# Patient Record
Sex: Male | Born: 1960 | Race: White | Hispanic: No | Marital: Married | Smoking: Never smoker
Health system: Southern US, Community
[De-identification: ages and names within clinical notes are randomized; demographics above are authoritative.]

## PROBLEM LIST (undated history)

## (undated) DIAGNOSIS — I10 Essential (primary) hypertension: Secondary | ICD-10-CM

## (undated) DIAGNOSIS — N200 Calculus of kidney: Secondary | ICD-10-CM

## (undated) DIAGNOSIS — E7800 Pure hypercholesterolemia, unspecified: Secondary | ICD-10-CM

---

## 2020-03-14 ENCOUNTER — Encounter: Payer: Self-pay | Admitting: Emergency Medicine

## 2020-03-14 ENCOUNTER — Observation Stay: Payer: BLUE CROSS/BLUE SHIELD

## 2020-03-14 ENCOUNTER — Other Ambulatory Visit: Payer: Self-pay

## 2020-03-14 ENCOUNTER — Observation Stay
Admission: EM | Admit: 2020-03-14 | Discharge: 2020-03-15 | Disposition: A | Discharge status: Home / Self Care | Payer: BLUE CROSS/BLUE SHIELD | Attending: Internal Medicine | Admitting: Internal Medicine

## 2020-03-14 ENCOUNTER — Emergency Department: Payer: BLUE CROSS/BLUE SHIELD

## 2020-03-14 DIAGNOSIS — R4701 Aphasia: Secondary | ICD-10-CM | POA: Diagnosis not present

## 2020-03-14 DIAGNOSIS — R410 Disorientation, unspecified: Secondary | ICD-10-CM | POA: Diagnosis present

## 2020-03-14 DIAGNOSIS — I10 Essential (primary) hypertension: Secondary | ICD-10-CM | POA: Insufficient documentation

## 2020-03-14 DIAGNOSIS — E785 Hyperlipidemia, unspecified: Secondary | ICD-10-CM | POA: Diagnosis not present

## 2020-03-14 DIAGNOSIS — R130 Aphagia: Secondary | ICD-10-CM

## 2020-03-14 DIAGNOSIS — Z20822 Contact with and (suspected) exposure to covid-19: Secondary | ICD-10-CM | POA: Diagnosis not present

## 2020-03-14 DIAGNOSIS — G459 Transient cerebral ischemic attack, unspecified: Secondary | ICD-10-CM | POA: Diagnosis present

## 2020-03-14 DIAGNOSIS — R519 Headache, unspecified: Secondary | ICD-10-CM | POA: Diagnosis not present

## 2020-03-14 DIAGNOSIS — Z7982 Long term (current) use of aspirin: Secondary | ICD-10-CM | POA: Insufficient documentation

## 2020-03-14 DIAGNOSIS — R531 Weakness: Secondary | ICD-10-CM | POA: Insufficient documentation

## 2020-03-14 DIAGNOSIS — Z79899 Other long term (current) drug therapy: Secondary | ICD-10-CM | POA: Diagnosis not present

## 2020-03-14 DIAGNOSIS — Z87442 Personal history of urinary calculi: Secondary | ICD-10-CM | POA: Diagnosis not present

## 2020-03-14 DIAGNOSIS — E7800 Pure hypercholesterolemia, unspecified: Secondary | ICD-10-CM | POA: Insufficient documentation

## 2020-03-14 HISTORY — DX: Pure hypercholesterolemia, unspecified: E78.00

## 2020-03-14 HISTORY — DX: Calculus of kidney: N20.0

## 2020-03-14 HISTORY — DX: Essential (primary) hypertension: I10

## 2020-03-14 LAB — COMPREHENSIVE METABOLIC PANEL
ALT: 38 U/L (ref 0–44)
AST: 21 U/L (ref 15–41)
Albumin: 4.2 g/dL (ref 3.5–5.0)
Alkaline Phosphatase: 63 U/L (ref 38–126)
Anion gap: 9 (ref 5–15)
BUN: 27 mg/dL — ABNORMAL HIGH (ref 6–20)
CO2: 25 mmol/L (ref 22–32)
Calcium: 9.5 mg/dL (ref 8.9–10.3)
Chloride: 105 mmol/L (ref 98–111)
Creatinine, Ser: 1.14 mg/dL (ref 0.61–1.24)
GFR calc Af Amer: >60 mL/min (ref >60)
GFR calc non Af Amer: >60 mL/min (ref >60)
Glucose, Bld: 89 mg/dL (ref 70–99)
Potassium: 3.6 mmol/L (ref 3.5–5.1)
Sodium: 139 mmol/L (ref 135–145)
Total Bilirubin: 0.9 mg/dL (ref 0.3–1.2)
Total Protein: 7.3 g/dL (ref 6.5–8.1)

## 2020-03-14 LAB — DIFFERENTIAL
Abs Immature Granulocytes: 0.02 10*3/uL (ref 0.00–0.07)
Basophils Absolute: 0.1 10*3/uL (ref 0.0–0.1)
Basophils Relative: 1 %
Eosinophils Absolute: 0.2 10*3/uL (ref 0.0–0.5)
Eosinophils Relative: 2 %
Immature Granulocytes: 0 %
Lymphocytes Relative: 27 %
Lymphs Abs: 2 10*3/uL (ref 0.7–4.0)
Monocytes Absolute: 0.8 10*3/uL (ref 0.1–1.0)
Monocytes Relative: 10 %
Neutro Abs: 4.5 10*3/uL (ref 1.7–7.7)
Neutrophils Relative %: 60 %

## 2020-03-14 LAB — CBC
HCT: 44.7 % (ref 39.0–52.0)
Hemoglobin: 15.5 g/dL (ref 13.0–17.0)
MCH: 30.9 pg (ref 26.0–34.0)
MCHC: 34.7 g/dL (ref 30.0–36.0)
MCV: 89.2 fL (ref 80.0–100.0)
Platelets: 292 10*3/uL (ref 150–400)
RBC: 5.01 MIL/uL (ref 4.22–5.81)
RDW: 12.4 % (ref 11.5–15.5)
WBC: 7.4 10*3/uL (ref 4.0–10.5)
nRBC: 0 % (ref 0.0–0.2)

## 2020-03-14 LAB — PROTIME-INR
INR: 1 (ref 0.8–1.2)
Prothrombin Time: 12.7 seconds (ref 11.4–15.2)

## 2020-03-14 LAB — GLUCOSE, CAPILLARY: Glucose-Capillary: 95 mg/dL (ref 70–99)

## 2020-03-14 LAB — APTT: aPTT: 35 seconds (ref 24–36)

## 2020-03-14 MED ORDER — ATORVASTATIN CALCIUM 20 MG PO TABS: 20.0000 mg | ORAL_TABLET | Freq: Every day | ORAL | Status: DC

## 2020-03-14 MED ORDER — IRBESARTAN 150 MG PO TABS
150.0000 mg | ORAL_TABLET | Freq: Every day | ORAL | Status: DC
  Administered 2020-03-15: 150 mg via ORAL
  Filled 2020-03-14: qty 1

## 2020-03-14 MED ORDER — STROKE: EARLY STAGES OF RECOVERY BOOK: Freq: Once | Status: AC

## 2020-03-14 MED ORDER — ATORVASTATIN CALCIUM 20 MG PO TABS
80.0000 mg | ORAL_TABLET | Freq: Every day | ORAL | Status: DC
  Administered 2020-03-15: 80 mg via ORAL
  Filled 2020-03-14 (×2): qty 4

## 2020-03-14 MED ORDER — SODIUM CHLORIDE 0.9% FLUSH: 3.0000 mL | Freq: Once | INTRAVENOUS | Status: DC

## 2020-03-14 MED ORDER — HYDROCHLOROTHIAZIDE 12.5 MG PO CAPS
12.5000 mg | ORAL_CAPSULE | Freq: Every day | ORAL | Status: DC
  Administered 2020-03-15: 12.5 mg via ORAL
  Filled 2020-03-14: qty 1

## 2020-03-14 MED ORDER — SODIUM CHLORIDE 0.9 % IV SOLN: INTRAVENOUS | Status: DC

## 2020-03-14 MED ORDER — VALSARTAN-HYDROCHLOROTHIAZIDE 320-25 MG PO TABS: 0.5000 | ORAL_TABLET | Freq: Every day | ORAL | Status: DC

## 2020-03-14 MED ORDER — COENZYME Q10 30 MG PO CAPS: 30.0000 mg | ORAL_CAPSULE | Freq: Every day | ORAL | Status: DC

## 2020-03-14 MED ORDER — ADULT MULTIVITAMIN W/MINERALS CH
1.0000 | ORAL_TABLET | Freq: Every day | ORAL | Status: DC
  Administered 2020-03-15: 01:00:00 1 via ORAL
  Filled 2020-03-14 (×2): qty 1

## 2020-03-14 MED ORDER — ACETAMINOPHEN 325 MG PO TABS: 650.0000 mg | ORAL_TABLET | ORAL | Status: DC | PRN

## 2020-03-14 MED ORDER — ENOXAPARIN SODIUM 40 MG/0.4ML ~~LOC~~ SOLN
40.0000 mg | SUBCUTANEOUS | Status: DC
  Administered 2020-03-15: 01:00:00 40 mg via SUBCUTANEOUS
  Filled 2020-03-14: qty 0.4

## 2020-03-14 MED ORDER — ACETAMINOPHEN 160 MG/5ML PO SOLN
650.0000 mg | ORAL | Status: DC | PRN
  Filled 2020-03-14: qty 20.3

## 2020-03-14 MED ORDER — ASPIRIN EC 325 MG PO TBEC
325.0000 mg | DELAYED_RELEASE_TABLET | Freq: Every day | ORAL | Status: DC
  Administered 2020-03-15: 325 mg via ORAL
  Filled 2020-03-14 (×2): qty 1

## 2020-03-14 MED ORDER — SENNOSIDES-DOCUSATE SODIUM 8.6-50 MG PO TABS: 1.0000 | ORAL_TABLET | Freq: Every evening | ORAL | Status: DC | PRN

## 2020-03-14 MED ORDER — ACETAMINOPHEN 650 MG RE SUPP: 650.0000 mg | RECTAL | Status: DC | PRN

## 2020-03-14 NOTE — ED Provider Notes (Signed)
[facility] ([facility]) Emergency Department Provider Note    ____________________________________________   I have reviewed the triage vital signs and the nursing notes.   HISTORY  Chief Complaint Altered Mental Status   History limited by: Not Limited   HPI Jeffery Morrison is a 59 y.o. male who presents to the emergency department today because of concerns for an episode of confusion and weakness.  The patient was out on a walk with his wife when started having difficulty finding the words he meant to say.  Wife also felt that he was weak so sat down on a park bench.  The episode lasted maybe 10 minutes.  Patient did not have any weakness in any specific arm or leg.  Denies any similar symptoms in the past.  Patient states that earlier in the day he had 2 ocular migraines.  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  Denies any recent trauma to his head.  Records reviewed. Per medical record review patient has a history of HTN, HLD.   Past Medical History:  Diagnosis Date  . High cholesterol   . Hypertension   . Kidney stone     There are no problems to display for this patient.   History reviewed. No pertinent surgical history.  Prior to Admission medications   Not on File    Allergies Patient has no known allergies.  No family history on file.  Social History Social History   Tobacco Use  . Smoking status: Never Smoker  . Smokeless tobacco: Never Used  Substance Use Topics  . Alcohol use: Yes  . Drug use: Not Currently    Review of Systems Constitutional: No fever/chills Eyes: No visual changes. ENT: No sore throat. Cardiovascular: Denies chest pain. Respiratory: Denies shortness of breath. Gastrointestinal: No abdominal pain.  No nausea, no vomiting.  No diarrhea.   Genitourinary: Negative for dysuria. Musculoskeletal: Negative for back pain. Skin: Negative for rash. Neurological: Positive for confusion  and word finding difficulty. ____________________________________________   PHYSICAL EXAM:  VITAL SIGNS: ED Triage Vitals  Enc Vitals Group     BP 03/14/20 1551 (!) 128/94     Pulse Rate 03/14/20 1551 69     Resp 03/14/20 1551 20     Temp 03/14/20 1551 98.1 F (36.7 C)     Temp Source 03/14/20 1551 Oral     SpO2 03/14/20 1551 95 %     Weight 03/14/20 1549 205 lb (93 kg)     Height 03/14/20 1549 5\' 11"  (1.803 m)     Head Circumference --      Peak Flow --      Pain Score 03/14/20 1549 0   Constitutional: Alert and oriented.  Eyes: Conjunctivae are normal.  ENT      Head: Normocephalic and atraumatic.      Nose: No congestion/rhinnorhea.      Mouth/Throat: Mucous membranes are moist.      Neck: No stridor. Hematological/Lymphatic/Immunilogical: No cervical lymphadenopathy. Cardiovascular: Normal rate, regular rhythm.  No murmurs, rubs, or gallops.  Respiratory: Normal respiratory effort without tachypnea nor retractions. Breath sounds are clear and equal bilaterally. No wheezes/rales/rhonchi. Gastrointestinal: Soft and non tender. No rebound. No guarding.  Genitourinary: Deferred Musculoskeletal: Normal range of motion in all extremities. No lower extremity edema. Neurologic:  Normal speech and language. PERRL. EOMI. Strength 5/5 in upper and lower extremities. Sensation intact. Finger to nose normal bilaterally. No gross focal neurologic deficits are appreciated.  Skin:  Skin is warm, dry and intact. No rash noted. Psychiatric: Mood and affect are normal. Speech and behavior are normal. Patient exhibits appropriate insight and judgment.  ____________________________________________    LABS (pertinent positives/negatives)  CBC wbc 7.4, hgb 15.5, plt 292 CMP wnl except bun 27 ____________________________________________   EKG  I, Nance Pear, attending physician, personally viewed and interpreted this EKG  EKG Time: 1550 Rate: 74 Rhythm: sinus rhythm Axis:  normal Intervals: qtc 428 QRS: narrow ST changes: no st elevation Impression: normal ekg  ____________________________________________    RADIOLOGY  CT head No acute abnormality  ____________________________________________   PROCEDURES  Procedures  ____________________________________________   INITIAL IMPRESSION / ASSESSMENT AND PLAN / ED COURSE  Pertinent labs & imaging results that were available during my care of the patient were reviewed by me and considered in my medical decision making (see chart for details).   Patient presented to the emergency department today because of concerns for an episode of difficulty with his speech and generalized weakness.  Episode had lasted about 10 minutes and was completely resolved by the time my exam.  Patient's work-up here in the emergency department without obvious etiology of the symptoms.  I do have concerns for possible TIA.  I discussed this with the patient.  His [place] care doctor [place] of [place] and I do have concerns for delayed obtaining outpatient work-up.  Because of this we had a discussion will plan on admission here for further TIA work-up and evaluation.  ___________________________________________   FINAL CLINICAL IMPRESSION(S) / ED DIAGNOSES  Final diagnoses:  Confusion  Aphagia     Note: This dictation was prepared with Dragon dictation. Any transcriptional errors that result from this process are unintentional     Nance Pear, MD 03/14/20 [phone]

## 2020-03-14 NOTE — ED Triage Notes (Signed)
Pt to ED via POV with wife. Pt states that him and wife were on a walk and patient became altered. Pt was having a hard time getting his words out. During walk pt had to stop and rest several times which is not normal for him. Pt wife states that the episode of AMS lasted about 10 minutes. Pt has returned to his baseline. Pt is in NAD.

## 2020-03-14 NOTE — ED Notes (Signed)
Pt being taken to MRI. Logan from MRI informed of bed upstairs at this time.

## 2020-03-14 NOTE — ED Notes (Signed)
Pt being screened for MRI at this time on the phone.

## 2020-03-14 NOTE — ED Notes (Signed)
Pt returned from CT via stretcher.

## 2020-03-14 NOTE — H&P (Signed)
History and Physical    Jeffery Morrison ZDG:[id number] DOB: 27-May-1961 DOA: 03/14/2020  PCP: System, Pcp Not In  Patient coming from: Home   Chief Complaint: Confusion, inability to get words out  HPI: Jeffery Morrison is a 59 y.o. male with medical history significant of dyslipidemia and hypertension presented after having confusion with words and could not get words out while he was walking with his wife.  He stated this morning he had a headache and decided to go to the park to walk.  At 1 point while he was trying to talk about tennis balls he could not get the words out.  Wife stated you could tell he was confused about what words to use and just could not get the appropriate words out him he was confused.  It lasted 10 minutes.  It resolved fully.  Did not have any upper extremity or lower extremity weakness, no facial drooping or any other neurologic deficits.  Wife wanted patient to come to the ER for further evaluation.  His symptoms had already resolved after 10 minutes and upon presentation to the ER he was back to his baseline.  He does not smoke. ED Course: In the ED CT of the head did not reveal any acute intracranial abnormality.  EKG was normal sinus rhythm without any acute ischemic ST changes.  Creatinine is 1.14, BUN 27.  Review of Systems: All systems reviewed and otherwise negative.    Past Medical History:  Diagnosis Date  . High cholesterol   . Hypertension   . Kidney stone     History reviewed. No pertinent surgical history.   reports that he has never smoked. He has never used smokeless tobacco. He reports current alcohol use. He reports previous drug use.  No Known Allergies  No family history on file.   Prior to Admission medications   Medication Sig Start Date End Date Taking? Authorizing Provider  atorvastatin (LIPITOR) 20 MG tablet Take 20 mg by mouth daily. 12/20/19  Yes [provider]  co-enzyme Q-10 30 MG capsule Take 30 mg by mouth daily. 12/23/19   Yes [provider]  Multiple Vitamin (MULTIVITAMIN WITH MINERALS) TABS tablet Take 1 tablet by mouth daily.   Yes [provider]  valsartan-hydrochlorothiazide (DIOVAN-HCT) 320-25 MG tablet Take 0.5 tablets by mouth daily. 02/24/20  Yes [provider]    Physical Exam: Vitals:   03/14/20 1551 03/14/20 1600 03/14/20 1645 03/14/20 1703  BP: (!) 128/94 129/86  125/85  Pulse: 69 71 71 67  Resp: 20 (!) 23 16 18   Temp: 98.1 F (36.7 C)     TempSrc: Oral     SpO2: 95% 96% 95% 96%  Weight:      Height:        Constitutional: NAD, calm, comfortable Vitals:   03/14/20 1551 03/14/20 1600 03/14/20 1645 03/14/20 1703  BP: (!) 128/94 129/86  125/85  Pulse: 69 71 71 67  Resp: 20 (!) 23 16 18   Temp: 98.1 F (36.7 C)     TempSrc: Oral     SpO2: 95% 96% 95% 96%  Weight:      Height:       Eyes: PERRL, lids and conjunctivae normal ENMT: Mucous membranes are moist.  Neck: normal, supple no carotid bruits Respiratory: clear to auscultation bilaterally, no wheezing, no crackles. Normal respiratory effort. No accessory muscle use.  Cardiovascular: Regular rate and rhythm, no murmurs / rubs / gallops. No extremity edema.  Abdomen: no tenderness, no masses  palpated.  Bowel sounds positive.  Musculoskeletal: no clubbing / cyanosis. No joint deformity upper and lower extremities. Good ROM,. Normal muscle tone.  Skin: no rashes, lesions, ulcers. No induration Neurologic: CN 2-12 grossly intact. Sensation intact,. Strength 5/5 in all 4.  Speech is clear, no facial droop  psychiatric: Normal judgment and insight. Alert and oriented x 3. Normal mood.    Labs on Admission: I have personally reviewed following labs and imaging studies  CBC: Recent Labs  Lab 03/14/20 1554  WBC 7.4  NEUTROABS 4.5  HGB 15.5  HCT 44.7  MCV 89.2  PLT 626   Basic Metabolic Panel: Recent Labs  Lab 03/14/20 1554  NA 139  K 3.6  CL 105  CO2 25  GLUCOSE 89  BUN 27*  CREATININE  1.14  CALCIUM 9.5   GFR: Estimated Creatinine Clearance: 83.3 mL/min (by C-G formula based on SCr of 1.14 mg/dL). Liver Function Tests: Recent Labs  Lab 03/14/20 1554  AST 21  ALT 38  ALKPHOS 63  BILITOT 0.9  PROT 7.3  ALBUMIN 4.2   No results for input(s): LIPASE, AMYLASE in the last 168 hours. No results for input(s): AMMONIA in the last 168 hours. Coagulation Profile: Recent Labs  Lab 03/14/20 1554  INR 1.0   Cardiac Enzymes: No results for input(s): CKTOTAL, CKMB, CKMBINDEX, TROPONINI in the last 168 hours. BNP (last 3 results) No results for input(s): PROBNP in the last 8760 hours. HbA1C: No results for input(s): HGBA1C in the last 72 hours. CBG: Recent Labs  Lab 03/14/20 1559  GLUCAP 95   Lipid Profile: No results for input(s): CHOL, HDL, LDLCALC, TRIG, CHOLHDL, LDLDIRECT in the last 72 hours. Thyroid Function Tests: No results for input(s): TSH, T4TOTAL, FREET4, T3FREE, THYROIDAB in the last 72 hours. Anemia Panel: No results for input(s): VITAMINB12, FOLATE, FERRITIN, TIBC, IRON, RETICCTPCT in the last 72 hours. Urine analysis: No results found for: COLORURINE, APPEARANCEUR, LABSPEC, PHURINE, GLUCOSEU, HGBUR, BILIRUBINUR, KETONESUR, PROTEINUR, UROBILINOGEN, NITRITE, LEUKOCYTESUR  Radiological Exams on Admission: CT Head Wo Contrast  Result Date: 03/14/2020 CLINICAL DATA:  Confusion EXAM: CT HEAD WITHOUT CONTRAST TECHNIQUE: Contiguous axial images were obtained from the base of the skull through the vertex without intravenous contrast. COMPARISON:  None. FINDINGS: Brain: Mild volume loss. No acute intracranial abnormality. Specifically, no hemorrhage, hydrocephalus, mass lesion, acute infarction, or significant intracranial injury. Vascular: No hyperdense vessel or unexpected calcification. Skull: No acute calvarial abnormality. Sinuses/Orbits: Visualized paranasal sinuses and mastoids clear. Orbital soft tissues unremarkable. Other: None IMPRESSION: No acute  intracranial abnormality. Electronically Signed   By: Rolm Baptise M.D.   On: 03/14/2020 16:37    EKG: Independently reviewed.  Normal sinus rhythm without any ischemic ST changes  Assessment/Plan Active Problems:   TIA (transient ischemic attack)    #1 TIA- CT of head negative for acute intracranial abnormality We will check fasting lipid panel, goal LDL less than 70 Check echocardiogram MRI of brain without contrast Check bilateral carotid ultrasounds Telemetry Increase Lipitor to 80 mg daily Start aspirin 325 daily Continue blood pressure medications  #2 hypertension-stable here Continue home medication losartan/hydrochlorthiazide  #3 dyslipidemia-continue on Lipitor but at higher dose Check fasting lipid panel  DVT prophylaxis: Lovenox Code Status: Full Family Communication: Wife at bedside updated Disposition Plan: Back home Consults called: None Admission status: Observation, as patient requires less than 2 midnight stays   Nolberto Hanlon MD [facility] Pager [phone]   If 7PM-7AM, please contact night-coverage [email or web address] Password [email or web address]  03/14/2020, 5:44 PM

## 2020-03-14 NOTE — Progress Notes (Signed)
Pt c/o " visual migraine" " flickering of sight". No other neuro symptoms, NIH 0. VSS WNL. Reyes Ivan, NP made aware. Pt states he has had these episodes for years. They typically reside with rest and "never last that long" No further intervention at this time. Will continue to monitor.

## 2020-03-14 NOTE — ED Notes (Signed)
Pt ambulatory to toilet with steady gait noted.  

## 2020-03-14 NOTE — ED Notes (Signed)
Hospitalist at bedside 

## 2020-03-14 NOTE — Progress Notes (Signed)
PHARMACIST - PHYSICIAN ORDER COMMUNICATION  CONCERNING: P&T Medication Policy on Herbal Medications  DESCRIPTION:  This patient's order for:  Co-enzyme Q10 capsules  has been noted.  This product(s) is classified as an "herbal" or natural product. Due to a lack of definitive safety studies or FDA approval, nonstandard manufacturing practices, plus the potential risk of unknown drug-drug interactions while on inpatient medications, the Pharmacy and Therapeutics Committee does not permit the use of "herbal" or natural products of this type within [facility].   ACTION TAKEN: The pharmacy department is unable to verify this order at this time and your patient has been informed of this safety policy. Please reevaluate patient's clinical condition at discharge and address if the herbal or natural product(s) should be resumed at that time.  Bettey Costa, PharmD Clinical Pharmacist 03/14/2020 5:55 PM

## 2020-03-15 ENCOUNTER — Observation Stay: Payer: BLUE CROSS/BLUE SHIELD

## 2020-03-15 ENCOUNTER — Observation Stay: Admit: 2020-03-15 | Payer: BLUE CROSS/BLUE SHIELD

## 2020-03-15 DIAGNOSIS — G459 Transient cerebral ischemic attack, unspecified: Secondary | ICD-10-CM | POA: Diagnosis not present

## 2020-03-15 DIAGNOSIS — E785 Hyperlipidemia, unspecified: Secondary | ICD-10-CM | POA: Diagnosis not present

## 2020-03-15 DIAGNOSIS — I10 Essential (primary) hypertension: Secondary | ICD-10-CM | POA: Diagnosis not present

## 2020-03-15 LAB — HIV ANTIBODY (ROUTINE TESTING W REFLEX): HIV Screen 4th Generation wRfx: NONREACTIVE

## 2020-03-15 LAB — SARS CORONAVIRUS 2 (TAT 6-24 HRS): SARS Coronavirus 2: NEGATIVE

## 2020-03-15 LAB — LIPID PANEL
Cholesterol: 160 mg/dL (ref 0–200)
HDL: 44 mg/dL (ref >40)
LDL Cholesterol: 91 mg/dL (ref 0–99)
Total CHOL/HDL Ratio: 3.6 RATIO
Triglycerides: 124 mg/dL (ref <150)
VLDL: 25 mg/dL (ref 0–40)

## 2020-03-15 LAB — HEMOGLOBIN A1C
Hgb A1c MFr Bld: 5.4 % (ref 4.8–5.6)
Mean Plasma Glucose: 108.28 mg/dL

## 2020-03-15 MED ORDER — ASPIRIN 325 MG PO TBEC: 325.0000 mg | DELAYED_RELEASE_TABLET | Freq: Every day | ORAL | 0 refills | Status: AC

## 2020-03-15 MED ORDER — ATORVASTATIN CALCIUM 80 MG PO TABS: 80.0000 mg | ORAL_TABLET | Freq: Every day | ORAL | 0 refills | Status: AC

## 2020-03-15 NOTE — Discharge Instructions (Signed)
Follow up with your primary doctor in 1 week, need an out patient ECHO cardiogram

## 2020-03-15 NOTE — Progress Notes (Signed)
OT Cancellation Note  Patient Details Name: Jeffery Morrison MRN: [id number] DOB: 1961-06-25   Cancelled Treatment:    Reason Eval/Treat Not Completed: Patient at procedure or test/ unavailable. Consult received, chart reviewed. Pt out of room for testing upon initial attempt. Will re-attempt as pt is available and medically appropriate.   Richrd Prime, MPH, MS, OTR/L ascom [phone] 03/15/20, 9:01 AM

## 2020-03-15 NOTE — Progress Notes (Signed)
Pt being discharged home, discharge instructions reviewed with and wife, states understanding, pt with no complaints at discharge, aware that he needs to follow up with PCP in 1 week and needs ECHO scheduled

## 2020-03-15 NOTE — Evaluation (Signed)
3Occupational Therapy Evaluation Patient Details Name: Jeffery Morrison MRN: [id number] DOB: February 26, 1961 Today's Date: 03/15/2020    History of Present Illness 59 y.o. male with PMHx significant of dyslipidemia and hypertension presented to ER after having confusion with words and could not get words out while he was walking with his wife lasting 10 minutes and resolved fully prior to presentation at ER. Work up negative for acute infarct.   Clinical Impression   Pt seen for OT evaluation this date. Prior to hospital admission, pt was independent in all aspects of ADL/IADL, working, and denies falls history in past 12 months. Pt lives with his spouse in a 1 story home + basement with 12 steps to enter from garage (primary entrance) with R hand rail. Currently pt reporting symptoms have resolved. Pt demonstrates baseline independence to perform ADL and mobility tasks and no strength, sensory, coordination, cognitive, or visual deficits appreciated with assessment. Pt indep with ambulating to/from bathroom and performing toileting and clothing mgt. No skilled OT needs identified. Will sign off. Please re-consult if additional OT needs arise.    Follow Up Recommendations  No OT follow up    Equipment Recommendations  None recommended by OT    Recommendations for Other Services       Precautions / Restrictions Precautions Precautions: None Restrictions Weight Bearing Restrictions: No      Mobility Bed Mobility Overal bed mobility: Independent                Transfers Overall transfer level: Independent                    Balance Overall balance assessment: Independent                                         ADL either performed or assessed with clinical judgement   ADL Overall ADL's : Independent                                             Vision Baseline Vision/History: Wears glasses Wears Glasses: At all times Patient Visual  Report: No change from baseline;Other (comment)(pt reports hx ocular migraines , not experiencing at time of evaluation) Vision Assessment?: No apparent visual deficits     Perception     Praxis      Pertinent Vitals/Pain Pain Assessment: No/denies pain     Hand Dominance Left   Extremity/Trunk Assessment Upper Extremity Assessment Upper Extremity Assessment: Overall WFL for tasks assessed   Lower Extremity Assessment Lower Extremity Assessment: Overall WFL for tasks assessed   Cervical / Trunk Assessment Cervical / Trunk Assessment: Normal   Communication Communication Communication: No difficulties   Cognition Arousal/Alertness: Awake/alert Behavior During Therapy: WFL for tasks assessed/performed Overall Cognitive Status: Within Functional Limits for tasks assessed                                     General Comments       Exercises Other Exercises Other Exercises: Pt/spouse instructed in s/s stroke; both verbalized understanding   Shoulder Instructions      Home Living Family/patient expects to be discharged to:: Private residence Living Arrangements: Spouse/significant other Available Help at Discharge: Family;Available  24 hours/day Type of Home: House Home Access: Stairs to enter [employer] of Steps: 12 from garage (normal entrance) Entrance Stairs-Rails: Right Home Layout: Laundry or work area in basement;One level     Bathroom Shower/Tub: Teacher, early years/pre: Standard     Home Equipment: Environmental consultant - 4 wheels;Grab bars - tub/shower   Additional Comments: rollator for his mother when she comes to visit      Prior Functioning/Environment Level of Independence: Independent        Comments: Pt indep with mobility, driving, working (teaches remotely for a playwriting program), ADL, IADL, no falls        OT Problem List:        OT Treatment/Interventions:      OT Goals(Current goals can be found in the  care plan section) Acute Rehab OT Goals Patient Stated Goal: go home and return to work OT Goal Formulation: All assessment and education complete, DC therapy  OT Frequency:     Barriers to D/C:            Co-evaluation              AM-PAC OT "6 Clicks" Daily Activity     Outcome Measure Help from another person eating meals?: None Help from another person taking care of personal grooming?: None Help from another person toileting, which includes using toliet, bedpan, or urinal?: None Help from another person bathing (including washing, rinsing, drying)?: None Help from another person to put on and taking off regular upper body clothing?: None Help from another person to put on and taking off regular lower body clothing?: None 6 Click Score: 24   End of Session    Activity Tolerance: Patient tolerated treatment well Patient left: in bed;with call bell/phone within reach  OT Visit Diagnosis: Cognitive communication deficit (R41.841) Symptoms and signs involving cognitive functions: Other cerebrovascular disease                Time: 9371-6967 OT Time Calculation (min): 19 min Charges:  OT General Charges $OT Visit: 1 Visit OT Evaluation $OT Eval Low Complexity: 1 Low  Jeni Salles, MPH, MS, OTR/L ascom [phone] 03/15/20, 11:09 AM

## 2020-03-15 NOTE — Discharge Summary (Signed)
Jeffery Morrison LOV:[id number] DOB: 1961/04/25 DOA: 03/14/2020  PCP: System, Pcp Not In  Admit date: 03/14/2020 Discharge date: 03/15/2020  Admitted From: Home Disposition: Home  Recommendations for Outpatient Follow-up:  1. Follow up with PCP in 1 week, will need echocardiogram with bubble study 2. Please obtain BMP/CBC in one week    Discharge Condition:Stable CODE STATUS: Full Diet recommendation: Heart Healthy  Brief/Interim Summary: Jeffery Morrison is a 59 y.o. male with medical history significant of dyslipidemia and hypertension presented after having confusion with words and could not get words out while he was walking with his wife.  He stated this morning he had a headache and decided to go to the park to walk.  At 1 point while he was trying to talk about tennis balls he could not get the words out.  Wife stated you could tell he was confused about what words to use and just could not get the appropriate words out him he was confused.  It lasted 10 minutes.  It resolved fully.  Did not have any upper extremity or lower extremity weakness, no facial drooping or any other neurologic deficits.  Wife wanted patient to come to the ER for further evaluation.  His symptoms had already resolved after 10 minutes and upon presentation to the ER he was back to his baseline.  He does not smoke.  CT of the head and MRI of the brain were unremarkable.   carotid ultrasounds did not reveal significant stenosis.  With TIA due to scheduling an echocardiogram not being done here patient was instructed to follow-up with his primary care as outpatient to get an echo cardiogram with bubble study.  He was on telemetry and there were no dysrhythmias noted.  He is stable to be discharged home.  He was instructed also that his goal LDL should be less than 70.  Discharge Diagnoses:  Active Problems:   TIA (transient ischemic attack)    Discharge Instructions  Discharge Instructions    Call MD for:  persistant nausea  and vomiting   Complete by: As directed    Diet - low sodium heart healthy   Complete by: As directed    Increase activity slowly   Complete by: As directed      Allergies as of 03/15/2020   No Known Allergies     Medication List    TAKE these medications   aspirin 325 MG EC tablet Take 1 tablet (325 mg total) by mouth daily. Start taking on: March 16, 2020   atorvastatin 80 MG tablet Commonly known as: LIPITOR Take 1 tablet (80 mg total) by mouth daily. Start taking on: March 16, 2020 What changed:   medication strength  how much to take   co-enzyme Q-10 30 MG capsule Take 30 mg by mouth daily.   multivitamin with minerals Tabs tablet Take 1 tablet by mouth daily.   valsartan-hydrochlorothiazide 320-25 MG tablet Commonly known as: DIOVAN-HCT Take 0.5 tablets by mouth daily.       No Known Allergies  Consultations:  None   Procedures/Studies: CT Head Wo Contrast  Result Date: 03/14/2020 CLINICAL DATA:  Confusion EXAM: CT HEAD WITHOUT CONTRAST TECHNIQUE: Contiguous axial images were obtained from the base of the skull through the vertex without intravenous contrast. COMPARISON:  None. FINDINGS: Brain: Mild volume loss. No acute intracranial abnormality. Specifically, no hemorrhage, hydrocephalus, mass lesion, acute infarction, or significant intracranial injury. Vascular: No hyperdense vessel or unexpected calcification. Skull: No acute calvarial abnormality. Sinuses/Orbits: Visualized paranasal sinuses  and mastoids clear. Orbital soft tissues unremarkable. Other: None IMPRESSION: No acute intracranial abnormality. Electronically Signed   By: Charlett Nose M.D.   On: 03/14/2020 16:37   MR BRAIN WO CONTRAST  Result Date: 03/14/2020 CLINICAL DATA:  Confusion and weakness EXAM: MRI HEAD WITHOUT CONTRAST TECHNIQUE: Multiplanar, multiecho pulse sequences of the brain and surrounding structures were obtained without intravenous contrast. COMPARISON:  None. FINDINGS: Brain:  There is no acute infarction or intracranial hemorrhage. There is no intracranial mass, mass effect, or edema. There is no hydrocephalus or extra-axial fluid collection. Minimal small foci of T2 hyperintensity in the supratentorial white matter likely reflecting nonspecific gliosis/demyelination of doubtful clinical significance. Ventricles and sulci are normal in size and configuration. Vascular: Major vessel flow voids at the skull base are preserved. Skull and upper cervical spine: Normal marrow signal is preserved. Sinuses/Orbits: Trace mucosal thickening.  Orbits are unremarkable. Other: Sella is unremarkable.  Mastoid air cells are clear. IMPRESSION: No acute infarction, hemorrhage, or mass. Electronically Signed   By: Guadlupe Spanish M.D.   On: 03/14/2020 18:49   US Carotid Bilateral (at [facility] and [facility] only)  Result Date: 03/15/2020 CLINICAL DATA:  59 year old male with a history of TIA EXAM: BILATERAL CAROTID DUPLEX ULTRASOUND TECHNIQUE: Wallace Cullens scale imaging, color Doppler and duplex ultrasound were performed of bilateral carotid and vertebral arteries in the neck. COMPARISON:  None. FINDINGS: Criteria: Quantification of carotid stenosis is based on velocity parameters that correlate the residual internal carotid diameter with NASCET-based stenosis levels, using the diameter of the distal internal carotid lumen as the denominator for stenosis measurement. The following velocity measurements were obtained: RIGHT ICA:  Systolic 80 cm/sec, Diastolic 26 cm/sec CCA:  85 cm/sec SYSTOLIC ICA/CCA RATIO:  0.9 ECA:  111 cm/sec LEFT ICA:  Systolic 83 cm/sec, Diastolic 39 cm/sec CCA:  97 cm/sec SYSTOLIC ICA/CCA RATIO:  0.9 ECA:  106 cm/sec Right Brachial SBP: Not acquired Left Brachial SBP: Not acquired RIGHT CAROTID ARTERY: No significant calcified disease of the right common carotid artery. Intermediate waveform maintained. Heterogeneous plaque without significant calcifications at the right carotid bifurcation. Low  resistance waveform of the right ICA. No significant tortuosity. RIGHT VERTEBRAL ARTERY: Antegrade flow with low resistance waveform. LEFT CAROTID ARTERY: No significant calcified disease of the left common carotid artery. Intermediate waveform maintained. Heterogeneous plaque at the left carotid bifurcation without significant calcifications. Low resistance waveform of the left ICA. LEFT VERTEBRAL ARTERY:  Antegrade flow with low resistance waveform. IMPRESSION: Color duplex indicates minimal heterogeneous plaque, with no hemodynamically significant stenosis by duplex criteria in the extracranial cerebrovascular circulation. Signed, Yvone Neu. Reyne Dumas, RPVI [facility] Electronically Signed   By: Gilmer Mor D.O.   On: 03/15/2020 09:04       Subjective: Feels well.  Has no complaints today.  No dizziness, lightheadedness or any other symptoms.  Discharge Exam: Vitals:   03/15/20 1200 03/15/20 1618  BP: 133/89 (!) 129/96  Pulse:  68  Resp:  20  Temp:  98.3 F (36.8 C)  SpO2:  96%   Vitals:   03/15/20 0815 03/15/20 1100 03/15/20 1200 03/15/20 1618  BP: 118/74  133/89 (!) 129/96  Pulse: 69   68  Resp: (!) 25 20  20   Temp: 97.7 F (36.5 C)   98.3 F (36.8 C)  TempSrc: Oral   Oral  SpO2: 95%   96%  Weight:      Height:        General: Pt is  alert, awake, not in acute distress Cardiovascular: RRR, S1/S2 +, no rubs, no gallops Respiratory: CTA bilaterally, no wheezing, no rhonchi Abdominal: Soft, NT, ND, bowel sounds + Extremities: no edema, no cyanosis Neuro exam cranial nerves II to XII grossly intact.  Alert oriented x3    The results of significant diagnostics from this hospitalization (including imaging, microbiology, ancillary and laboratory) are listed below for reference.     Microbiology: Recent Results (from the past 240 hour(s))  SARS CORONAVIRUS 2 (TAT 6-24 HRS) Nasopharyngeal Nasopharyngeal Swab      Status: None   Collection Time: 03/14/20  5:59 PM   Specimen: Nasopharyngeal Swab  Result Value Ref Range Status   SARS Coronavirus 2 NEGATIVE NEGATIVE Final    Comment: (NOTE) SARS-CoV-2 target nucleic acids are NOT DETECTED. The SARS-CoV-2 RNA is generally detectable in upper and lower respiratory specimens during the acute phase of infection. Negative results do not preclude SARS-CoV-2 infection, do not rule out co-infections with other pathogens, and should not be used as the sole basis for treatment or other patient management decisions. Negative results must be combined with clinical observations, patient history, and epidemiological information. The expected result is Negative. Fact Sheet for Patients: HairSlick.no Fact Sheet for Healthcare Providers: quierodirigir.com This test is not yet approved or cleared by the [place] FDA and  has been authorized for detection and/or diagnosis of SARS-CoV-2 by FDA under an Emergency Use Authorization (EUA). This EUA will remain  in effect (meaning this test can be used) for the duration of the COVID-19 declaration under Section 56 4(b)(1) of the Act, 21 U.S.C. section 360bbb-3(b)(1), unless the authorization is terminated or revoked sooner. Performed at [facility], [address], [place], [place] [address]      Labs: BNP (last 3 results) No results for input(s): BNP in the last 8760 hours. Basic Metabolic Panel: Recent Labs  Lab 03/14/20 1554  NA 139  K 3.6  CL 105  CO2 25  GLUCOSE 89  BUN 27*  CREATININE 1.14  CALCIUM 9.5   Liver Function Tests: Recent Labs  Lab 03/14/20 1554  AST 21  ALT 38  ALKPHOS 63  BILITOT 0.9  PROT 7.3  ALBUMIN 4.2   No results for input(s): LIPASE, AMYLASE in the last 168 hours. No results for input(s): AMMONIA in the last 168 hours. CBC: Recent Labs  Lab 03/14/20 1554  WBC 7.4  NEUTROABS 4.5  HGB 15.5  HCT 44.7   MCV 89.2  PLT 292   Cardiac Enzymes: No results for input(s): CKTOTAL, CKMB, CKMBINDEX, TROPONINI in the last 168 hours. BNP: Invalid input(s): POCBNP CBG: Recent Labs  Lab 03/14/20 1559  GLUCAP 95   D-Dimer No results for input(s): DDIMER in the last 72 hours. Hgb A1c Recent Labs    03/15/20 0504  HGBA1C 5.4   Lipid Profile Recent Labs    03/15/20 0504  CHOL 160  HDL 44  LDLCALC 91  TRIG 124  CHOLHDL 3.6   Thyroid function studies No results for input(s): TSH, T4TOTAL, T3FREE, THYROIDAB in the last 72 hours.  Invalid input(s): FREET3 Anemia work up No results for input(s): VITAMINB12, FOLATE, FERRITIN, TIBC, IRON, RETICCTPCT in the last 72 hours. Urinalysis No results found for: COLORURINE, APPEARANCEUR, LABSPEC, PHURINE, GLUCOSEU, HGBUR, BILIRUBINUR, KETONESUR, PROTEINUR, UROBILINOGEN, NITRITE, LEUKOCYTESUR Sepsis Labs Invalid input(s): PROCALCITONIN,  WBC,  LACTICIDVEN Microbiology Recent Results (from the past 240 hour(s))  SARS CORONAVIRUS 2 (TAT 6-24 HRS) Nasopharyngeal Nasopharyngeal Swab     Status:  None   Collection Time: 03/14/20  5:59 PM   Specimen: Nasopharyngeal Swab  Result Value Ref Range Status   SARS Coronavirus 2 NEGATIVE NEGATIVE Final    Comment: (NOTE) SARS-CoV-2 target nucleic acids are NOT DETECTED. The SARS-CoV-2 RNA is generally detectable in upper and lower respiratory specimens during the acute phase of infection. Negative results do not preclude SARS-CoV-2 infection, do not rule out co-infections with other pathogens, and should not be used as the sole basis for treatment or other patient management decisions. Negative results must be combined with clinical observations, patient history, and epidemiological information. The expected result is Negative. Fact Sheet for Patients: HairSlick.no Fact Sheet for Healthcare Providers: quierodirigir.com This test is not yet approved  or cleared by the [place] FDA and  has been authorized for detection and/or diagnosis of SARS-CoV-2 by FDA under an Emergency Use Authorization (EUA). This EUA will remain  in effect (meaning this test can be used) for the duration of the COVID-19 declaration under Section 56 4(b)(1) of the Act, 21 U.S.C. section 360bbb-3(b)(1), unless the authorization is terminated or revoked sooner. Performed at [facility], [address] [place], [place] [address]    #1 TIA- CT of head negative for acute intracranial abnormality MRI of brain without contrast negative for acute infarct bilateral carotid ultrasounds no significant stenosis Telemetry Increase Lipitor to 80 mg daily, goal LDL less than 70  aspirin 419 daily Continue blood pressure medications Echo with bubble study as outpatient  #2 hypertension-stable here Continue home medication losartan/hydrochlorthiazide  #3 dyslipidemia-continue on Lipitor but at higher dose   Time coordinating discharge: Over 30 minutes  SIGNED:   Lynn Ito, MD  [facility] 03/15/2020, 5:37 PM Pager   If 7PM-7AM, please contact night-coverage [email or web address] Password [email or web address]

## 2020-03-15 NOTE — Progress Notes (Signed)
PT Cancellation Note  Patient Details Name: Jeffery Morrison MRN: [id number] DOB: 1960/12/26   Cancelled Treatment:    Reason Eval/Treat Not Completed: PT screened, no needs identified, will sign off.  PT consult received.  Chart reviewed.  Nurse reporting pt with no identified impairments or PT needs.  Pt reports being back to baseline (no symptoms).  Intact B LE light touch sensation, tone, proprioception, heel to shin coordination, and strength.  Independent with bed mobility, transfers, ambulation 200 feet no AD, and modified independent ambulating 4 steps with railing.  Pt reports no pain during screen.  Vitals stable and WFL throughout screen.  Based on PT screen: No PT needs identified.  Will sign off.  Hendricks Limes, PT 03/15/20, 1:04 PM

## 2020-03-15 NOTE — Progress Notes (Signed)
SLP Cancellation Note  Patient Details Name: Jeffery Morrison MRN: [id number] DOB: December 31, 1960   Cancelled treatment:       Reason Eval/Treat Not Completed: SLP screened, no needs identified, will sign off(chart reviewed; consulted NSG then met w/ pt in room). Pt denied any difficulty swallowing and is currently on a regular diet; tolerates swallowing pills w/ water per NSG. Pt conversed in conversation w/out deficits noted; pt and family member denied any speech-language deficits.  No further skilled ST services indicated as pt appears at his baseline. Pt agreed. NSG to reconsult if any change in status.     Orinda Kenner, MS, CCC-SLP Ivelisse Culverhouse 03/15/2020, 3:53 PM

## 2022-03-11 IMAGING — US US CAROTID DUPLEX BILAT
1 series · 13 of 24 positions shown · non-contrast
Comparison: None.

CLINICAL DATA: 58-year-old male with a history of TIA

EXAM:
BILATERAL CAROTID DUPLEX ULTRASOUND
TECHNIQUE: Gray scale imaging, color Doppler and duplex ultrasound were
performed of bilateral carotid and vertebral arteries in the neck.

[Series 1: us carotid bilateral · 13 of 68 slices shown]
[im 1/68]
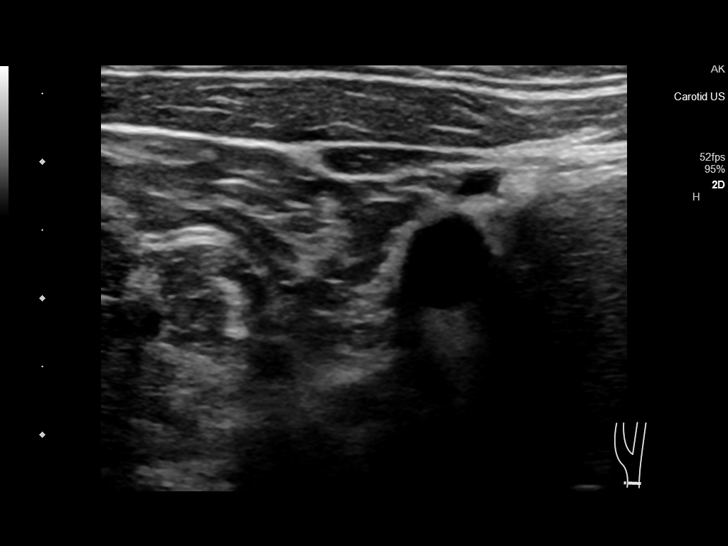
[im 6/68]
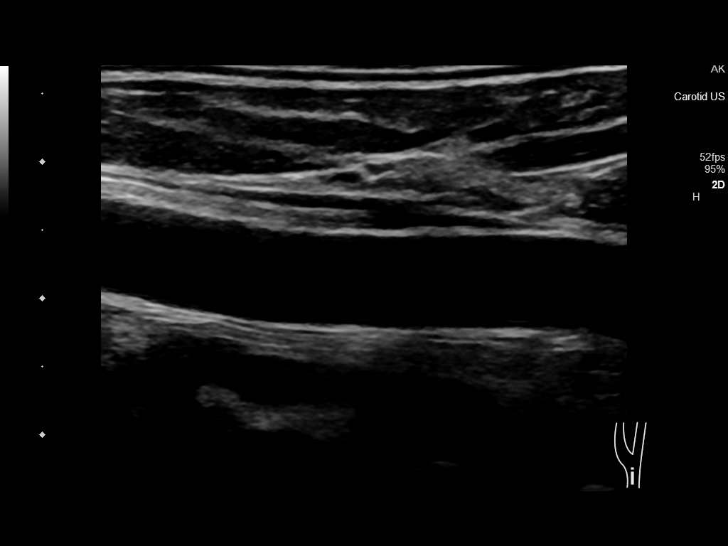
[im 12/68]
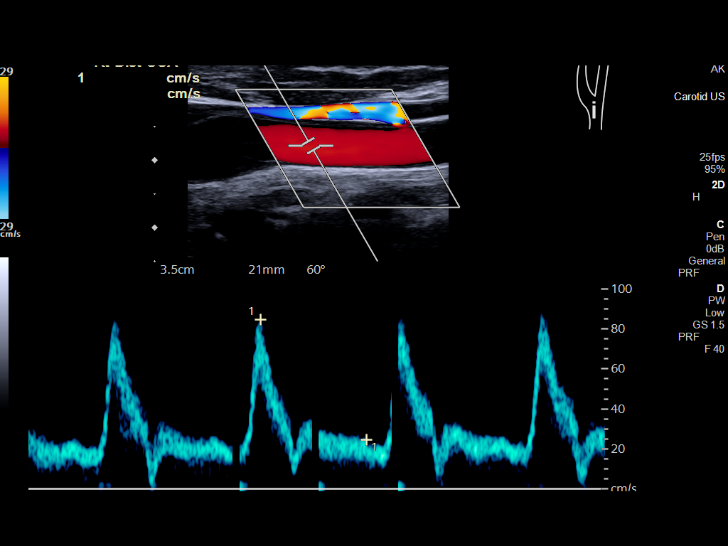
[im 18/68]
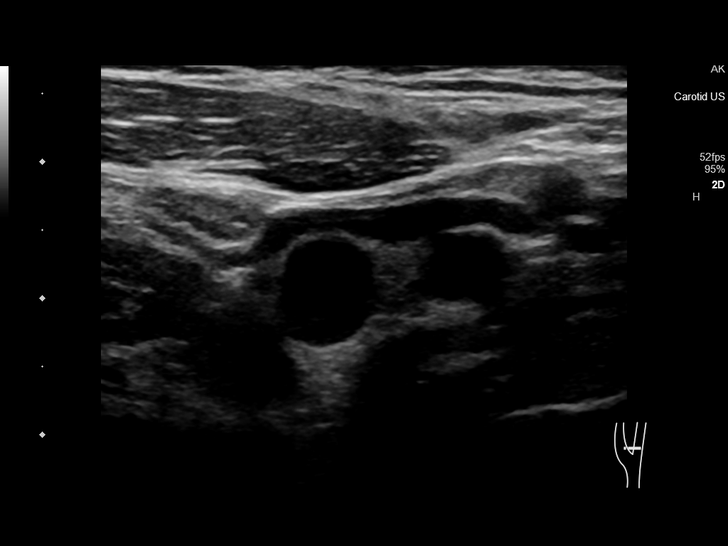
[im 24/68]
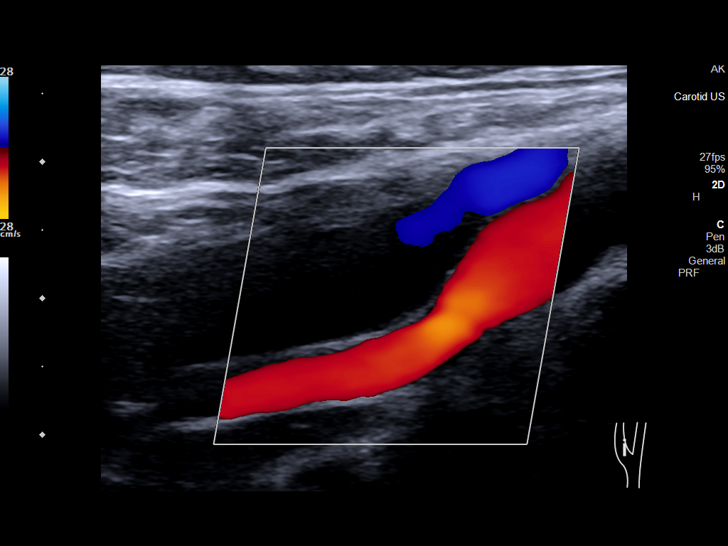
[im 30/68]
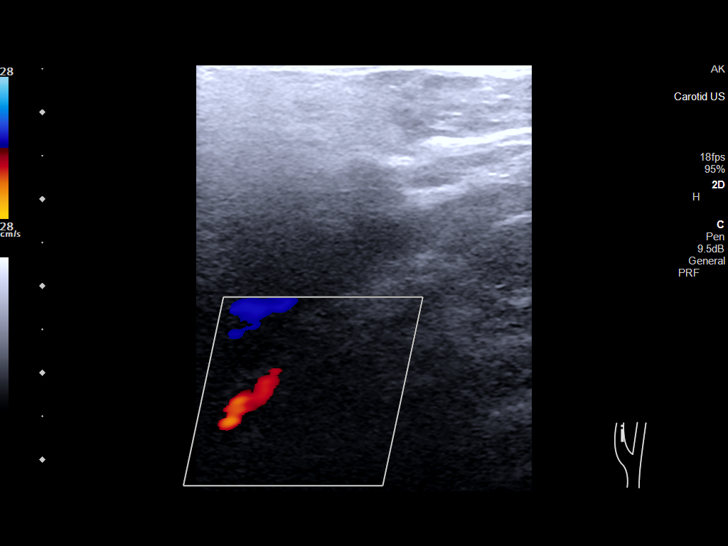
[im 35/68]
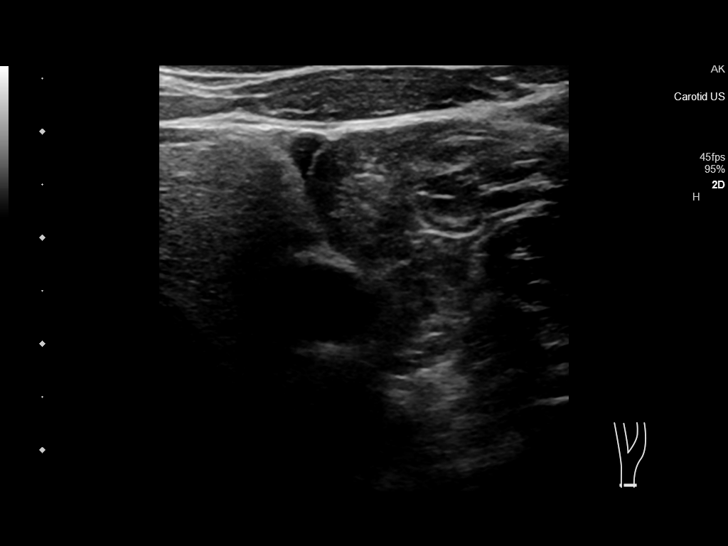
[im 38/68]
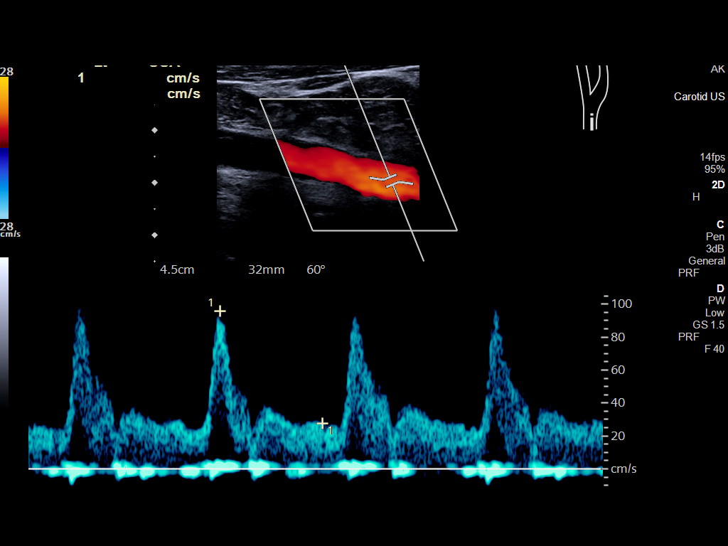
[im 44/68]
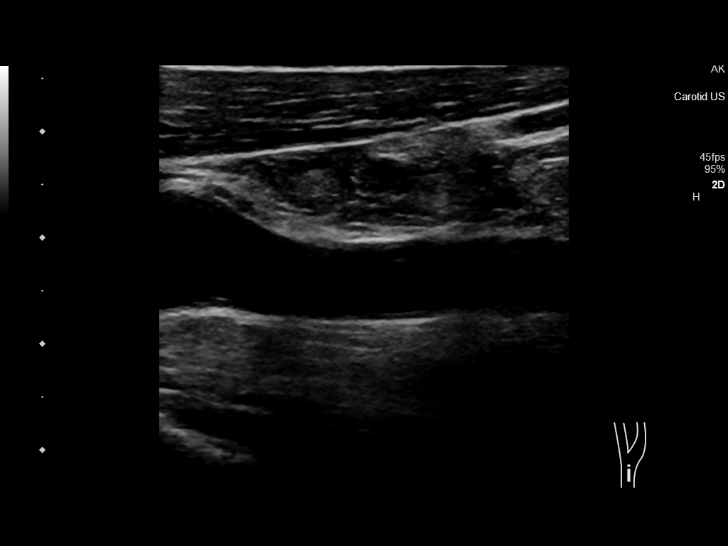
[im 50/68]
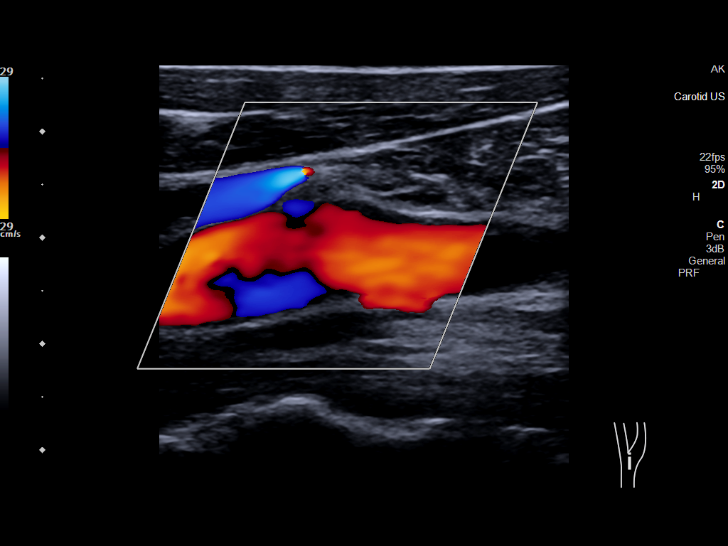
[im 56/68]
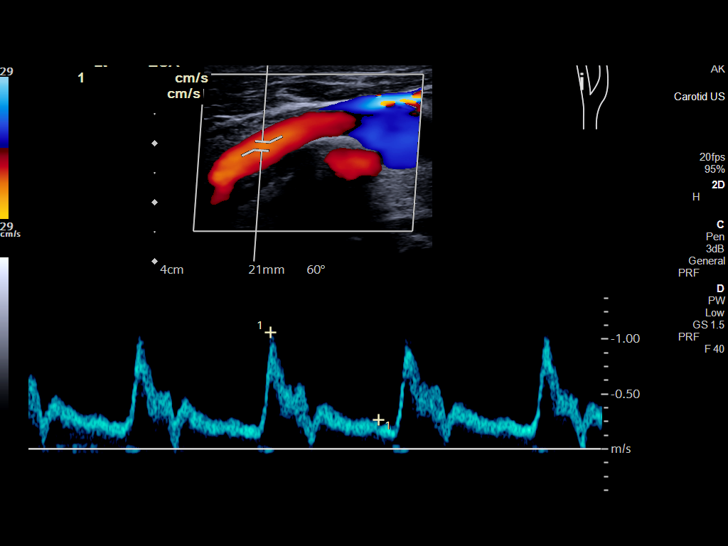
[im 62/68]
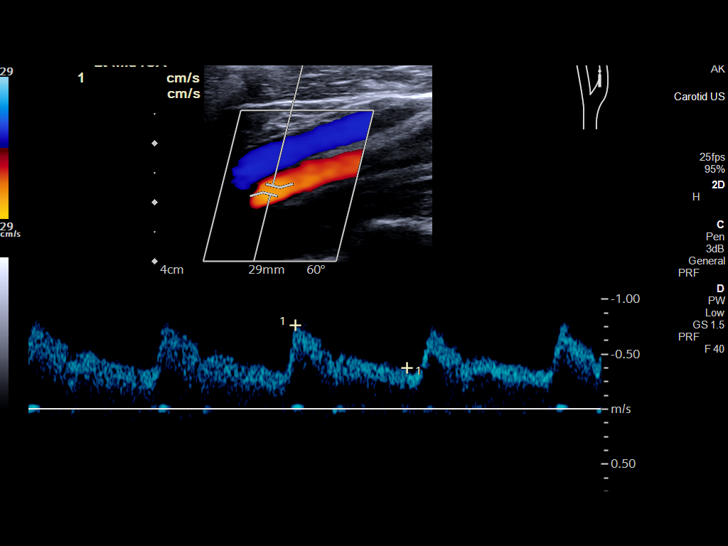
[im 68/68]
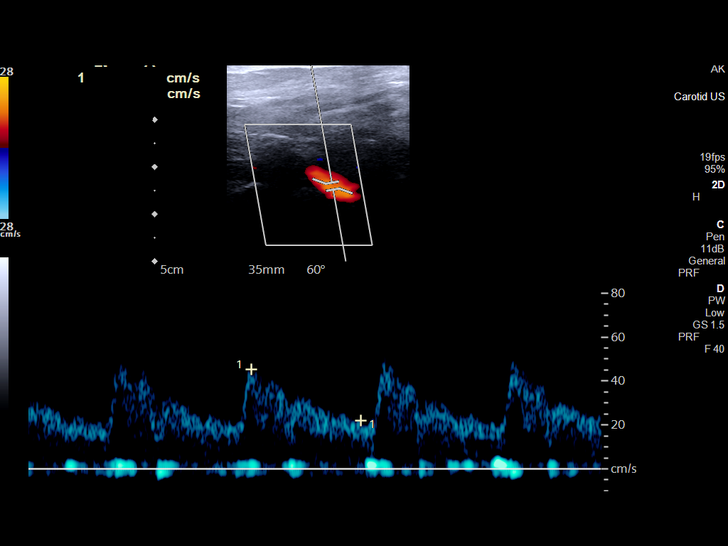

[13 of 24 positions shown; findings below may reference images not displayed]

FINDINGS: Criteria: Quantification of carotid stenosis is based on velocity
parameters that correlate the residual internal carotid diameter
with NASCET-based stenosis levels, using the diameter of the distal
internal carotid lumen as the denominator for stenosis measurement.

The following velocity measurements were obtained:

RIGHT

ICA:  Systolic 80 cm/sec, Diastolic 26 cm/sec

CCA:  85 cm/sec

SYSTOLIC ICA/CCA RATIO:

ECA:  111 cm/sec

LEFT

ICA:  Systolic 83 cm/sec, Diastolic 39 cm/sec

CCA:  97 cm/sec

SYSTOLIC ICA/CCA RATIO:

ECA:  106 cm/sec

Right Brachial SBP: Not acquired

Left Brachial SBP: Not acquired

RIGHT CAROTID ARTERY: No significant calcified disease of the right
common carotid artery. Intermediate waveform maintained.
Heterogeneous plaque without significant calcifications at the right
carotid bifurcation. Low resistance waveform of the right ICA. No
significant tortuosity.

RIGHT VERTEBRAL ARTERY: Antegrade flow with low resistance waveform.

LEFT CAROTID ARTERY: No significant calcified disease of the left
common carotid artery. Intermediate waveform maintained.
Heterogeneous plaque at the left carotid bifurcation without
significant calcifications. Low resistance waveform of the left ICA.

LEFT VERTEBRAL ARTERY:  Antegrade flow with low resistance waveform.
IMPRESSION: Color duplex indicates minimal heterogeneous plaque, with no
hemodynamically significant stenosis by duplex criteria in the
extracranial cerebrovascular circulation.

## 2023-03-10 ENCOUNTER — Ambulatory Visit
Admission: EM | Admit: 2023-03-10 | Discharge: 2023-03-10 | Disposition: A | Discharge status: Home / Self Care | Payer: BLUE CROSS/BLUE SHIELD | Attending: Urgent Care | Admitting: Urgent Care

## 2023-03-10 DIAGNOSIS — K047 Periapical abscess without sinus: Secondary | ICD-10-CM | POA: Diagnosis not present

## 2023-03-10 MED ORDER — AMOXICILLIN-POT CLAVULANATE 875-125 MG PO TABS: 1.0000 | ORAL_TABLET | Freq: Two times a day (BID) | ORAL | 0 refills | Status: AC

## 2023-03-10 NOTE — ED Provider Notes (Addendum)
Roderic Palau    CSN: [id number] Arrival date & time: 03/10/23  1342      History   Chief Complaint No chief complaint on file.   HPI Jeffery Morrison is a 62 y.o. male.   HPI  Triaged by provider.  Patient endorses dental pain.  He states he has had dental pain treated with ibuprofen but recently has noticed a bulge or bump in the exterior gum where he has a known cracked tooth.  He is concerned for abscess.  Patient states he is traveling from his home in [place] and plans to follow-up with his dentist when he returns.  Past Medical History:  Diagnosis Date   High cholesterol    Hypertension    Kidney stone     Patient Active Problem List   Diagnosis Date Noted   TIA (transient ischemic attack) 03/14/2020    No past surgical history on file.     Home Medications    Prior to Admission medications   Medication Sig Start Date End Date Taking? Authorizing Provider  aspirin EC 325 MG EC tablet Take 1 tablet (325 mg total) by mouth daily. 03/16/20   Nolberto Hanlon, MD  atorvastatin (LIPITOR) 80 MG tablet Take 1 tablet (80 mg total) by mouth daily. 03/16/20   Nolberto Hanlon, MD  co-enzyme Q-10 30 MG capsule Take 30 mg by mouth daily. 12/23/19   [provider]  Multiple Vitamin (MULTIVITAMIN WITH MINERALS) TABS tablet Take 1 tablet by mouth daily.    [provider]  valsartan-hydrochlorothiazide (DIOVAN-HCT) 320-25 MG tablet Take 0.5 tablets by mouth daily. 02/24/20   [provider]    Family History No family history on file.  Social History Social History   Tobacco Use   Smoking status: Never   Smokeless tobacco: Never  Substance Use Topics   Alcohol use: Yes   Drug use: Not Currently     Allergies   Patient has no known allergies.   Review of Systems Review of Systems   Physical Exam Triage Vital Signs ED Triage Vitals [03/10/23 1434]  Enc Vitals Group     BP 112/73     Pulse Rate 81     Resp 18     Temp 98 F (36.7  C)     Temp Source Oral     SpO2 94 %     Weight      Height      Head Circumference      Peak Flow      Pain Score      Pain Loc      Pain Edu?      Excl. in [place]?    No data found.  Updated Vital Signs BP 112/73 (BP Location: Left Arm)   Pulse 81   Temp 98 F (36.7 C) (Oral)   Resp 18   SpO2 94%   Visual Acuity Right Eye Distance:   Left Eye Distance:   Bilateral Distance:    Right Eye Near:   Left Eye Near:    Bilateral Near:     Physical Exam Vitals reviewed.  Constitutional:      Appearance: Normal appearance.  HENT:     Mouth/Throat:   Skin:    General: Skin is warm and dry.  Neurological:     General: No focal deficit present.     Mental Status: He is alert and oriented to person, place, and time.  Psychiatric:        Mood  and Affect: Mood normal.        Behavior: Behavior normal.      UC Treatments / Results  Labs (all labs ordered are listed, but only abnormal results are displayed) Labs Reviewed - No data to display  EKG   Radiology No results found.  Procedures Procedures (including critical care time)  Medications Ordered in UC Medications - No data to display  Initial Impression / Assessment and Plan / UC Course  I have reviewed the triage vital signs and the nursing notes.  Pertinent labs & imaging results that were available during my care of the patient were reviewed by me and considered in my medical decision making (see chart for details).   Possible abscess and treating with Augmentin.  Patient will follow-up with his dentist at earliest opportunity.  Patient acknowledges understanding and agreement with this treatment plan.   Final Clinical Impressions(s) / UC Diagnoses   Final diagnoses:  None   Discharge Instructions   None    ED Prescriptions   None    PDMP not reviewed this encounter.   Rose Phi, [place] 03/10/23 1443    Rose Phi, [place] 03/10/23 1444

## 2023-03-10 NOTE — ED Notes (Signed)
Triaged by provider  

## 2023-03-10 NOTE — Discharge Instructions (Addendum)
Follow up here or with your primary care provider if your symptoms are worsening or not improving.
